# Patient Record
Sex: Male | Born: 1990 | Race: Black or African American | Hispanic: No | Marital: Single | Smoking: Never smoker
Health system: Southern US, Community
[De-identification: ages and names within clinical notes are randomized; demographics above are authoritative.]

## PROBLEM LIST (undated history)

## (undated) DIAGNOSIS — I10 Essential (primary) hypertension: Secondary | ICD-10-CM

---
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
## 2012-07-02 ENCOUNTER — Emergency Department (HOSPITAL_COMMUNITY)
Admission: EM | Admit: 2012-07-02 | Discharge: 2012-07-02 | Disposition: A | Discharge status: Home / Self Care | Payer: BC Managed Care – PPO | Attending: Emergency Medicine | Admitting: Emergency Medicine

## 2012-07-02 ENCOUNTER — Encounter (HOSPITAL_COMMUNITY): Payer: Self-pay | Admitting: *Deleted

## 2012-07-02 DIAGNOSIS — R509 Fever, unspecified: Secondary | ICD-10-CM | POA: Insufficient documentation

## 2012-07-02 DIAGNOSIS — I10 Essential (primary) hypertension: Secondary | ICD-10-CM | POA: Insufficient documentation

## 2012-07-02 DIAGNOSIS — Z79899 Other long term (current) drug therapy: Secondary | ICD-10-CM | POA: Insufficient documentation

## 2012-07-02 DIAGNOSIS — J029 Acute pharyngitis, unspecified: Secondary | ICD-10-CM

## 2012-07-02 HISTORY — DX: Essential (primary) hypertension: I10

## 2012-07-02 LAB — COMPREHENSIVE METABOLIC PANEL
Albumin: 4.4 g/dL (ref 3.5–5.2)
Alkaline Phosphatase: 64 U/L (ref 39–117)
BUN: 17 mg/dL (ref 6–23)
Chloride: 103 mEq/L (ref 96–112)
Creatinine, Ser: 1.3 mg/dL (ref 0.50–1.35)
Sodium: 140 mEq/L (ref 135–145)
Total Protein: 7.7 g/dL (ref 6.0–8.3)

## 2012-07-02 LAB — CBC WITH DIFFERENTIAL/PLATELET
Eosinophils Absolute: 0 10*3/uL (ref 0.0–0.7)
HCT: 44.4 % (ref 39.0–52.0)
Hemoglobin: 15.9 g/dL (ref 13.0–17.0)
Lymphocytes Relative: 18 % (ref 12–46)
MCH: 30.5 pg (ref 26.0–34.0)
Monocytes Absolute: 1.3 10*3/uL — ABNORMAL HIGH (ref 0.1–1.0)
Monocytes Relative: 15 % — ABNORMAL HIGH (ref 3–12)
Platelets: 191 10*3/uL (ref 150–400)
RBC: 5.22 MIL/uL (ref 4.22–5.81)
WBC: 8.4 10*3/uL (ref 4.0–10.5)

## 2012-07-02 LAB — LACTIC ACID, PLASMA: Lactic Acid, Venous: 1 mmol/L (ref 0.5–2.2)

## 2012-07-02 MED ORDER — ACETAMINOPHEN 325 MG PO TABS
650.0000 mg | ORAL_TABLET | Freq: Four times a day (QID) | ORAL | Status: DC | PRN
  Administered 2012-07-02: 650 mg via ORAL
  Filled 2012-07-02: qty 2

## 2012-07-02 MED ORDER — PENICILLIN G BENZATHINE 1200000 UNIT/2ML IM SUSP
1.2000 10*6.[IU] | Freq: Once | INTRAMUSCULAR | Status: AC
  Administered 2012-07-02: 1.2 10*6.[IU] via INTRAMUSCULAR
  Filled 2012-07-02: qty 2

## 2012-07-02 MED ORDER — DEXAMETHASONE 10 MG/ML FOR PEDIATRIC ORAL USE
10.0000 mg | Freq: Once | INTRAMUSCULAR | Status: AC
  Administered 2012-07-02: 10 mg via ORAL
  Filled 2012-07-02: qty 1

## 2012-07-02 NOTE — ED Notes (Signed)
Sore throat, fever, chills, fatigue since yesterday 0100.  Took 600 mg ibuprofen prior to coming into hospital tonite.

## 2012-07-02 NOTE — ED Notes (Signed)
Pt comfortable with d/c and f/u instructions. 

## 2012-07-02 NOTE — ED Provider Notes (Signed)
History     CSN: [id number]  Arrival date & time 07/02/12  0020   First MD Initiated Contact with Patient 07/02/12 0128      Chief Complaint  Patient presents with  . Fever  . Sore Throat     HPI Pt was seen at 0150.   Per pt, c/o gradual onset and persistence of constant sore throat since yesterday.  Has been associated with home fevers.  States he has been taking motrin without relief. Denies rash, no CP/SOB, no N/V/D, no abd pain.    Past Medical History  Diagnosis Date  . Hypertension     History reviewed. No pertinent past surgical history.   History  Substance Use Topics  . Smoking status: Never Smoker   . Smokeless tobacco: Not on file  . Alcohol Use: No      Review of Systems ROS: Statement: All systems negative except as marked or noted in the HPI; Constitutional: +fever and chills. ; ; Eyes: Negative for eye pain, redness and discharge. ; ; ENMT: Negative for ear pain, hoarseness, nasal congestion, sinus pressure and +sore throat. ; ; Cardiovascular: Negative for chest pain, palpitations, diaphoresis, dyspnea and peripheral edema. ; ; Respiratory: Negative for cough, wheezing and stridor. ; ; Gastrointestinal: Negative for nausea, vomiting, diarrhea, abdominal pain, blood in stool, hematemesis, jaundice and rectal bleeding. . ; ; Genitourinary: Negative for dysuria, flank pain and hematuria. ; ; Musculoskeletal: Negative for back pain and neck pain. Negative for swelling and trauma.; ; Skin: Negative for pruritus, rash, abrasions, blisters, bruising and skin lesion.; ; Neuro: Negative for headache, lightheadedness and neck stiffness. Negative for weakness, altered level of consciousness , altered mental status, extremity weakness, paresthesias, involuntary movement, seizure and syncope.       Allergies  Review of patient's allergies indicates no known allergies.  Home Medications   Current Outpatient Rx  Name  Route  Sig  Dispense  Refill  . amLODipine  (NORVASC) 10 MG tablet   Oral   Take 10 mg by mouth daily.         . Cyanocobalamin (VITAMIN B-12 PO)   Oral   Take 1 tablet by mouth daily.         Marland Kitchen ibuprofen (ADVIL,MOTRIN) 200 MG tablet   Oral   Take 600 mg by mouth every 6 (six) hours as needed for pain.         Marland Kitchen lisinopril (PRINIVIL,ZESTRIL) 10 MG tablet   Oral   Take 10 mg by mouth daily.         . Pseudoeph-Doxylamine-DM-APAP (NYQUIL PO)   Oral   Take 30 mLs by mouth every 12 (twelve) hours as needed (for cold symptoms).           BP 158/82  Pulse 114  Temp(Src) 99.9 F (37.7 C) (Oral)  SpO2 96%  Physical Exam 0155: Physical examination:  Nursing notes reviewed; Vital signs and O2 SAT reviewed;  Constitutional: Well developed, Well nourished, Well hydrated, In no acute distress; Head:  Normocephalic, atraumatic; Eyes: EOMI, PERRL, No scleral icterus; ENMT: TM's clear bilat. +edemetous nasal turbinates bilat with clear rhinorrhea.  +posterior pharyngeal erythema, +tonsillar erythema and scant exudate, Grade III tonsils. Mouth and pharynx without lesions. No intra-oral edema. No submandibular or sublingual edema. No hoarse voice, no drooling, no stridor. No pain with manipulation of larynx. Mouth normal, Mucous membranes moist; Neck: Supple, Full range of motion, +anterior cervical lymphadenopathy; Cardiovascular: Regular rate and rhythm, No murmur, rub, or gallop; Respiratory:  Breath sounds clear & equal bilaterally, No rales, rhonchi, wheezes.  Speaking full sentences with ease, Normal respiratory effort/excursion; Chest: Nontender, Movement normal; Abdomen: Soft, Nontender, Nondistended, Normal bowel sounds; Genitourinary: No CVA tenderness; Extremities: Pulses normal, No tenderness, No edema, No calf edema or asymmetry.; Neuro: AA&Ox3, Major CN grossly intact.  Speech clear. No gross focal motor or sensory deficits in extremities.; Skin: Color normal, Warm, Dry.   ED Course  Procedures     MDM   MDM Reviewed: previous chart, nursing note and vitals Interpretation: labs     Results for orders placed during the hospital encounter of 07/02/12  RAPID STREP SCREEN      Result Value Range   Streptococcus, Group A Screen (Direct) NEGATIVE  NEGATIVE  LACTIC ACID, PLASMA      Result Value Range   Lactic Acid, Venous 1.0  0.5 - 2.2 mmol/L  CBC WITH DIFFERENTIAL      Result Value Range   WBC 8.4  4.0 - 10.5 K/uL   RBC 5.22  4.22 - 5.81 MIL/uL   Hemoglobin 15.9  13.0 - 17.0 g/dL   HCT 16.1  09.6 - 04.5 %   MCV 85.1  78.0 - 100.0 fL   MCH 30.5  26.0 - 34.0 pg   MCHC 35.8  30.0 - 36.0 g/dL   RDW 40.9  81.1 - 91.4 %   Platelets 191  150 - 400 K/uL   Neutrophils Relative 66  43 - 77 %   Neutro Abs 5.6  1.7 - 7.7 K/uL   Lymphocytes Relative 18  12 - 46 %   Lymphs Abs 1.6  0.7 - 4.0 K/uL   Monocytes Relative 15 (*) 3 - 12 %   Monocytes Absolute 1.3 (*) 0.1 - 1.0 K/uL   Eosinophils Relative 0  0 - 5 %   Eosinophils Absolute 0.0  0.0 - 0.7 K/uL   Basophils Relative 0  0 - 1 %   Basophils Absolute 0.0  0.0 - 0.1 K/uL  COMPREHENSIVE METABOLIC PANEL      Result Value Range   Sodium 140  135 - 145 mEq/L   Potassium 4.3  3.5 - 5.1 mEq/L   Chloride 103  96 - 112 mEq/L   CO2 28  19 - 32 mEq/L   Glucose, Bld 98  70 - 99 mg/dL   BUN 17  6 - 23 mg/dL   Creatinine, Ser 7.82  0.50 - 1.35 mg/dL   Calcium 9.4  8.4 - 95.6 mg/dL   Total Protein 7.7  6.0 - 8.3 g/dL   Albumin 4.4  3.5 - 5.2 g/dL   AST 19  0 - 37 U/L   ALT 27  0 - 53 U/L   Alkaline Phosphatase 64  39 - 117 U/L   Total Bilirubin 0.2 (*) 0.3 - 1.2 mg/dL   GFR calc non Af Amer 79 (*) >90 mL/min   GFR calc Af Amer >90  >90 mL/min     0300:  4 Centor criteria, will tx with abx. Pt agreeable to IM PCN while in the ED.  PO decadron given for discomfort.  APAP given for fever.  States he "feels better" and wants to go home.  Dx and testing d/w pt.  Questions answered.  Verb understanding, agreeable to d/c home with outpt  f/u.          Laray Anger, DO 07/06/12 [phone]

## 2015-07-06 ENCOUNTER — Emergency Department (HOSPITAL_BASED_OUTPATIENT_CLINIC_OR_DEPARTMENT_OTHER): Payer: BLUE CROSS/BLUE SHIELD

## 2015-07-06 ENCOUNTER — Encounter (HOSPITAL_BASED_OUTPATIENT_CLINIC_OR_DEPARTMENT_OTHER): Payer: Self-pay | Admitting: Emergency Medicine

## 2015-07-06 ENCOUNTER — Emergency Department (HOSPITAL_BASED_OUTPATIENT_CLINIC_OR_DEPARTMENT_OTHER)
Admission: EM | Admit: 2015-07-06 | Discharge: 2015-07-06 | Disposition: A | Discharge status: Home / Self Care | Payer: BLUE CROSS/BLUE SHIELD | Attending: Emergency Medicine | Admitting: Emergency Medicine

## 2015-07-06 DIAGNOSIS — R05 Cough: Secondary | ICD-10-CM

## 2015-07-06 DIAGNOSIS — J029 Acute pharyngitis, unspecified: Secondary | ICD-10-CM | POA: Diagnosis present

## 2015-07-06 DIAGNOSIS — J069 Acute upper respiratory infection, unspecified: Secondary | ICD-10-CM | POA: Diagnosis not present

## 2015-07-06 DIAGNOSIS — R059 Cough, unspecified: Secondary | ICD-10-CM

## 2015-07-06 DIAGNOSIS — Z79899 Other long term (current) drug therapy: Secondary | ICD-10-CM | POA: Diagnosis not present

## 2015-07-06 DIAGNOSIS — I10 Essential (primary) hypertension: Secondary | ICD-10-CM | POA: Diagnosis not present

## 2015-07-06 LAB — RAPID STREP SCREEN (MED CTR MEBANE ONLY): STREPTOCOCCUS, GROUP A SCREEN (DIRECT): NEGATIVE

## 2015-07-06 MED ORDER — BENZONATATE 100 MG PO CAPS: 100.0000 mg | ORAL_CAPSULE | Freq: Three times a day (TID) | ORAL | Status: DC

## 2015-07-06 MED ORDER — IBUPROFEN 800 MG PO TABS: 800.0000 mg | ORAL_TABLET | Freq: Three times a day (TID) | ORAL | Status: AC

## 2015-07-06 NOTE — ED Provider Notes (Signed)
CSN: [id number]     Arrival date & time 07/06/15  2057 History   First MD Initiated Contact with Patient 07/06/15 2125     Chief Complaint  Patient presents with  . Sore Throat    (Consider location/radiation/quality/duration/timing/severity/associated sxs/prior Treatment) Patient is a 25 y.o. male presenting with pharyngitis. The history is provided by the patient and medical records. No language interpreter was used.  Sore Throat Associated symptoms include congestion, coughing, a fever and a sore throat. Pertinent negatives include no abdominal pain, nausea, neck pain, rash or vomiting.  Clinton Peterson is a 25 y.o. male  with a PMH of HTN who presents to the Emergency Department complaining of worsening productive cough, sore throat 2 days. Associated symptoms include subjective fever and nasal congestion. TheraFlu taken prior to arrival with little relief of symptoms. Denies chest pain, shortness of breath, abdominal pain, n/v.    Past Medical History  Diagnosis Date  . Hypertension    History reviewed. No pertinent past surgical history. History reviewed. No pertinent family history. Social History  Substance Use Topics  . Smoking status: Never Smoker   . Smokeless tobacco: None  . Alcohol Use: Yes     Comment: rarly    Review of Systems  Constitutional: Positive for fever.  HENT: Positive for congestion and sore throat.   Eyes: Negative for visual disturbance.  Respiratory: Positive for cough. Negative for shortness of breath and wheezing.   Cardiovascular: Negative.   Gastrointestinal: Negative for nausea, vomiting and abdominal pain.  Genitourinary: Negative for dysuria.  Musculoskeletal: Negative for back pain and neck pain.  Skin: Negative for rash.  Neurological: Negative for dizziness.      Allergies  Review of patient's allergies indicates no known allergies.  Home Medications   Prior to Admission medications   Medication Sig Start Date End Date Taking?  Authorizing Provider  amLODipine (NORVASC) 10 MG tablet Take 10 mg by mouth daily.    Historical Provider, MD  benzonatate (TESSALON) 100 MG capsule Take 1 capsule (100 mg total) by mouth every 8 (eight) hours. 07/06/15   Chase Picket Ward, PA-C  Cyanocobalamin (VITAMIN B-12 PO) Take 1 tablet by mouth daily.    Historical Provider, MD  ibuprofen (ADVIL,MOTRIN) 800 MG tablet Take 1 tablet (800 mg total) by mouth 3 (three) times daily. 07/06/15   Chase Picket Ward, PA-C  lisinopril (PRINIVIL,ZESTRIL) 10 MG tablet Take 10 mg by mouth daily.    Historical Provider, MD  Pseudoeph-Doxylamine-DM-APAP (NYQUIL PO) Take 30 mLs by mouth every 12 (twelve) hours as needed (for cold symptoms).    Historical Provider, MD   BP 151/84 mmHg  Pulse 110  Temp(Src) 99.4 F (37.4 C) (Oral)  Resp 18  Ht  (1.854 m)  Wt 108.863 kg  BMI 31.67 kg/m2  SpO2 98% Physical Exam  Constitutional: He is oriented to person, place, and time. He appears well-developed and well-nourished. No distress.  HENT:  Head: Normocephalic and atraumatic.  OP with erythema, no exudates or tonsillar hypertrophy. + nasal congestion with mucosal edema.   Neck: Normal range of motion. Neck supple.  No meningeal signs.   Cardiovascular: Normal rate, regular rhythm and normal heart sounds.   Pulmonary/Chest: Effort normal.  Lungs are clear to auscultation bilaterally - no w/r/r  Abdominal: Soft. He exhibits no distension. There is no tenderness.  Musculoskeletal: Normal range of motion.  Neurological: He is alert and oriented to person, place, and time.  Skin: Skin is warm and dry. He  is not diaphoretic.  Nursing note and vitals reviewed.   ED Course  Procedures (including critical care time) Labs Review Labs Reviewed  [facility]ID ST[facility] ([facility])  CULTURE, GROUP A STREP [facility]    Imaging Review Dg Chest 2 View  07/06/2015  CLINICAL DATA:  25 year old male with cough EXAM: CHEST  2 VIEW COMPARISON:  None. FINDINGS: The  heart size and mediastinal contours are within normal limits. Both lungs are clear. The visualized skeletal structures are unremarkable. IMPRESSION: No active cardiopulmonary disease. Electronically Signed   By: Elgie CollardArash  Radparvar M.D.   On: 07/06/2015 23:13   I have personally reviewed and evaluated these images and lab results as part of my medical decision-making.   EKG Interpretation None      MDM   Final diagnoses:  Cough  URI (upper respiratory infection)   Clinton Peterson is non-toxic appearing with a clear lung exam.  Temp 99.4. Mild rhinorrhea and OP with erythema, no exudates. Rapid strep negative, chest x-ray unremarkable. Likely viral URI. Patient is agreeable to symptomatic treatment with close follow up with PCP as needed but spoke at length about emergent, changing, or worsening of symptoms that should prompt return to ER. Patient voices understanding and is agreeable to plan. rx for ibuprofen and tessalon given.   Blood pressure 151/84, pulse 110, temperature 99.4 F (37.4 C), temperature source Oral, resp. rate 18, height 6\' 1"  (1.854 m), weight 108.863 kg, SpO2 98 %.  [facility] Va [facility] CenterJaime Pilcher Ward, PA-C 07/06/15 2334  Melene Planan Floyd, DO 07/07/15 Ernestina [id number]

## 2015-07-06 NOTE — Discharge Instructions (Signed)
1. Medications: mucinex as needed for congestion, tessalon as needed for cough, usual home medications 2. Treatment: rest, increase hydration, drink plenty of fluids, take ibuprofen for body aches/fever control 3. Follow Up: Please follow up with your primary doctor in 3 days if no improvement in symptoms for discussion of your diagnoses and further evaluation after today's visit; Return to the ER for high fevers, difficulty breathing or other concerning symptoms

## 2015-07-06 NOTE — ED Notes (Signed)
Patient states that he has a fever and cough with sore throat x 2 days. Took theraflu night time prior to coming to hospital

## 2015-07-09 LAB — CULTURE, GROUP A STREP (THRC)

## 2017-07-10 IMAGING — CR DG CHEST 2V
2 series · 2 of 2 positions shown · non-contrast
Comparison: None.

CLINICAL DATA: 24-year-old male with cough

EXAM:
CHEST  2 VIEW

[w chest pa]
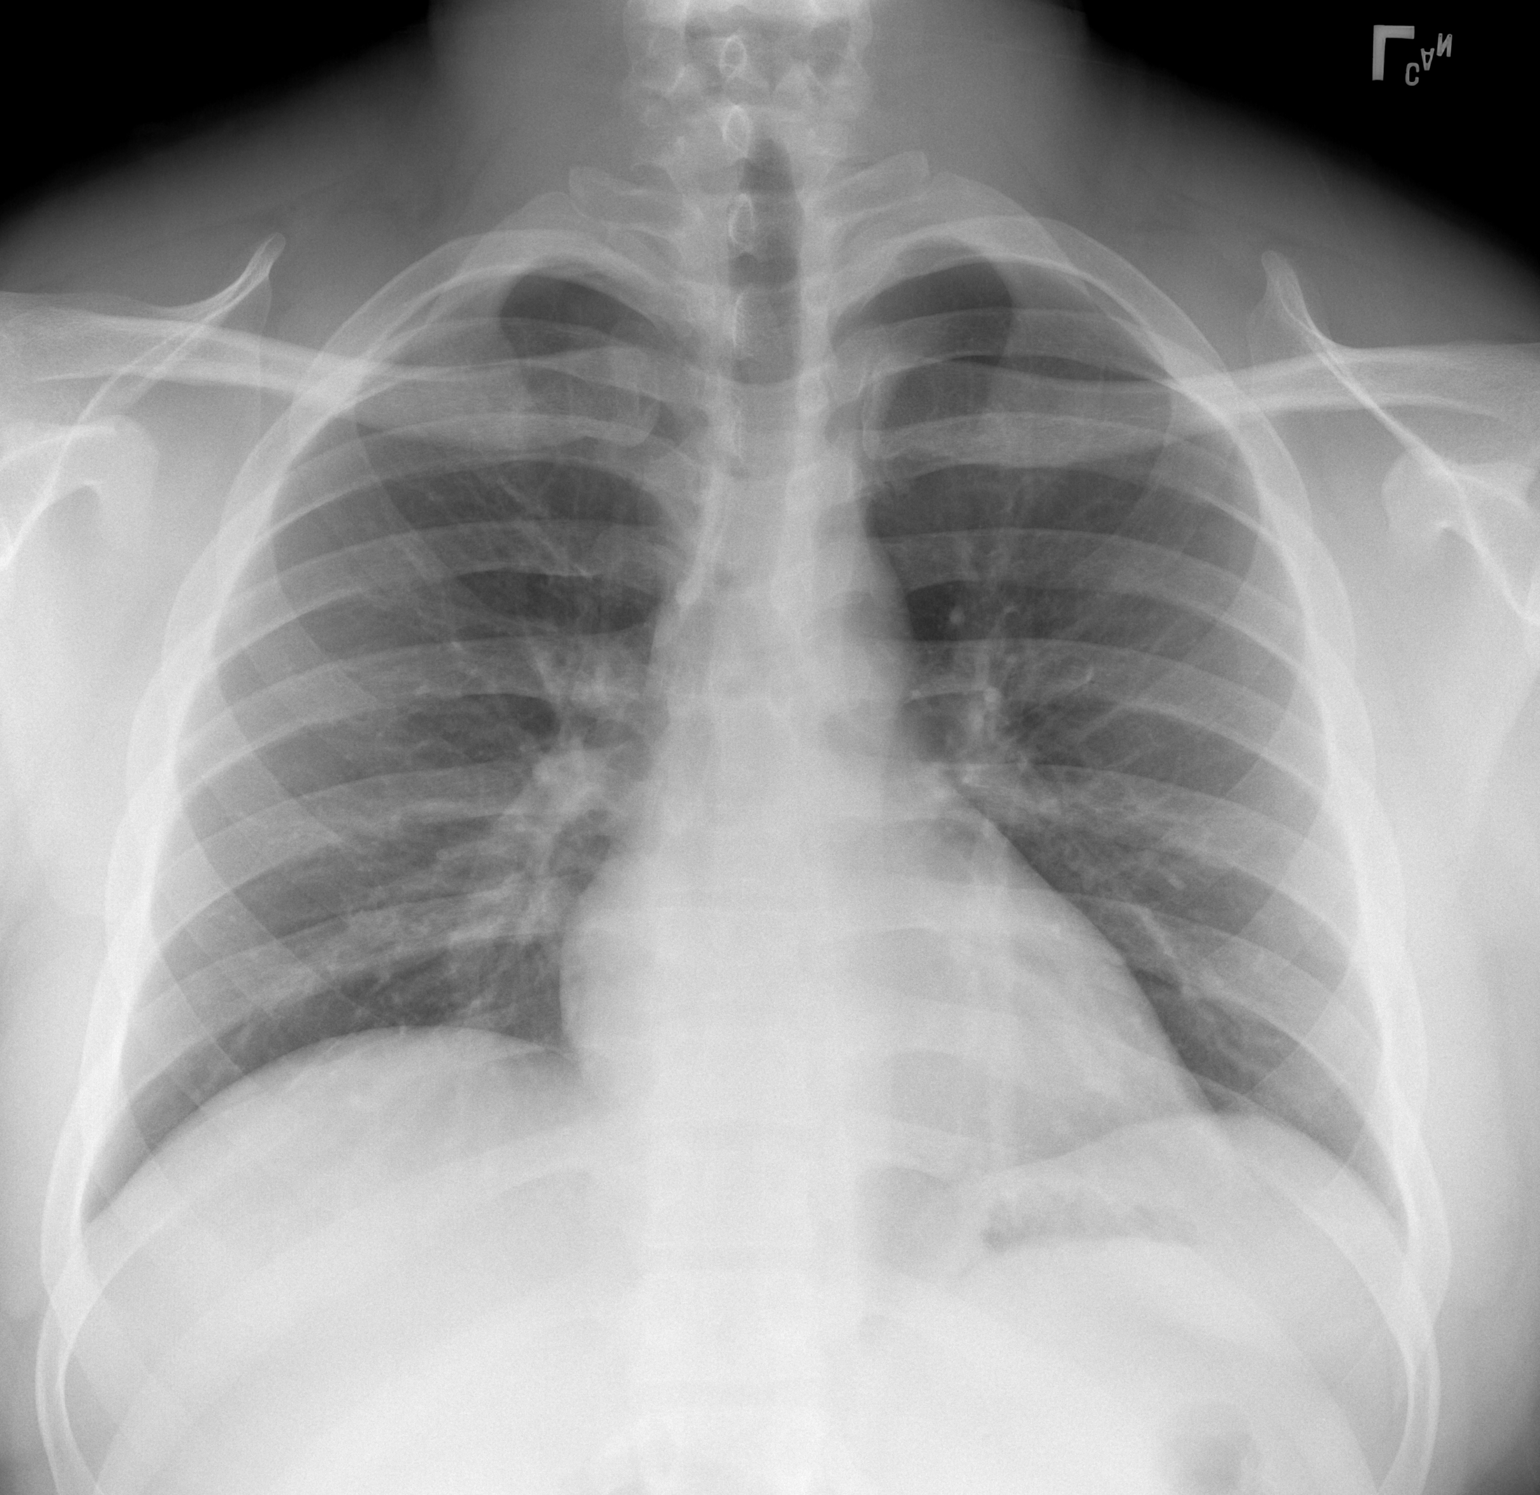

[w chest lat]
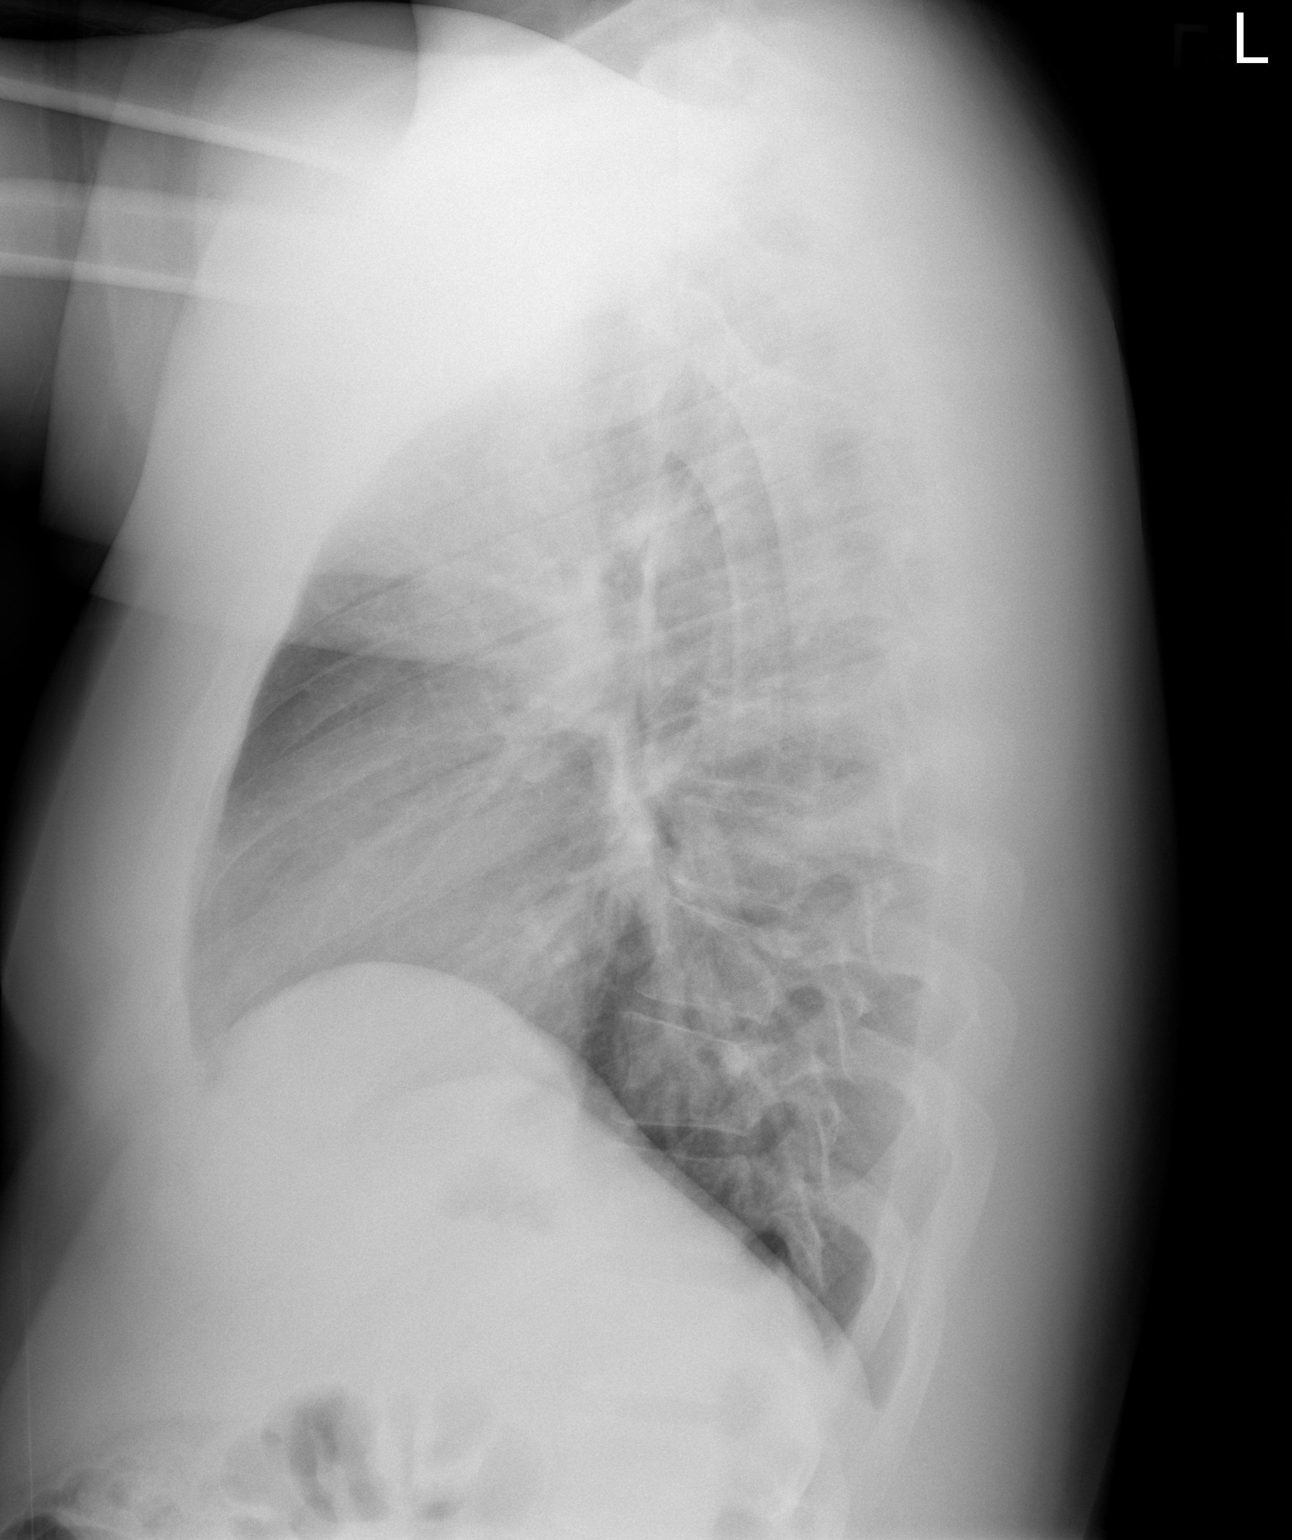

[2 of 2 positions shown; findings below may reference images not displayed]

FINDINGS: The heart size and mediastinal contours are within normal limits.
Both lungs are clear. The visualized skeletal structures are
unremarkable.
IMPRESSION: No active cardiopulmonary disease.

## 2019-05-07 ENCOUNTER — Ambulatory Visit: Payer: Self-pay | Attending: Internal Medicine

## 2019-05-07 DIAGNOSIS — Z23 Encounter for immunization: Secondary | ICD-10-CM

## 2019-05-07 NOTE — Progress Notes (Signed)
   Covid-19 Vaccination Clinic  Name:  Clinton Peterson    MRN: [id number] DOB: 07/15/1990  05/07/2019  [name] was observed post Covid-19 immunization for 15 minutes without incident. He was provided with Vaccine Information Sheet and instruction to access the V-Safe system.   [name] was instructed to call 911 with any severe reactions post vaccine: Marland Kitchen Difficulty breathing  . Swelling of face and throat  . A fast heartbeat  . A bad rash all over body  . Dizziness and weakness   Immunizations Administered    Name Date Dose VIS Date Route   Pfizer COVID-19 Vaccine 05/07/2019  1:38 PM 0.3 mL 02/02/2019 Intramuscular   Manufacturer: [employer], [employer]   Lot: [id number]   NDC: [address]-8

## 2019-05-29 ENCOUNTER — Ambulatory Visit: Payer: Self-pay | Attending: Internal Medicine

## 2019-05-29 DIAGNOSIS — Z23 Encounter for immunization: Secondary | ICD-10-CM

## 2019-05-29 NOTE — Progress Notes (Signed)
   Covid-19 Vaccination Clinic  Name:  WYLAN GENTZLER    MRN: [id number] DOB: [birth year]  05/29/2019  Mr. Gorter was observed post Covid-19 immunization for 15 minutes without incident. He was provided with Vaccine Information Sheet and instruction to access the V-Safe system.   Mr. Asmar was instructed to call 911 with any severe reactions post vaccine: Marland Kitchen Difficulty breathing  . Swelling of face and throat  . A fast heartbeat  . A bad rash all over body  . Dizziness and weakness   Immunizations Administered    Name Date Dose VIS Date Route   Pfizer COVID-19 Vaccine 05/29/2019  3:55 PM 0.3 mL 02/02/2019 Intramuscular   Manufacturer: [employer], [employer]   Lot: [id number]   NDC: [address]-9

## 2023-02-22 ENCOUNTER — Ambulatory Visit (INDEPENDENT_AMBULATORY_CARE_PROVIDER_SITE_OTHER): Payer: Managed Care, Other (non HMO)

## 2023-02-22 ENCOUNTER — Encounter (HOSPITAL_COMMUNITY): Payer: Self-pay | Admitting: Emergency Medicine

## 2023-02-22 ENCOUNTER — Ambulatory Visit (HOSPITAL_COMMUNITY)
Admission: EM | Admit: 2023-02-22 | Discharge: 2023-02-22 | Disposition: A | Discharge status: Home / Self Care | Payer: Managed Care, Other (non HMO) | Attending: Internal Medicine | Admitting: Internal Medicine

## 2023-02-22 DIAGNOSIS — S6991XA Unspecified injury of right wrist, hand and finger(s), initial encounter: Secondary | ICD-10-CM | POA: Diagnosis not present

## 2023-02-22 DIAGNOSIS — S63104A Unspecified dislocation of right thumb, initial encounter: Secondary | ICD-10-CM

## 2023-02-22 NOTE — ED Provider Notes (Addendum)
 [facility]    CSN: [id number] Arrival date & time: 02/22/23  9050      History   Chief Complaint Chief Complaint  Patient presents with   Finger Injury    HPI Clinton Peterson is a 32 y.o. male who presents with R thumb injjury when his dog pulled on the leash jerking it and caused it to hit  the tip of his thumb on the wall.     Past Medical History:  Diagnosis Date   Hypertension     There are no active problems to display for this patient.   History reviewed. No pertinent surgical history.     Home Medications    Prior to Admission medications   Medication Sig Start Date End Date Taking? Authorizing Provider  amLODipine (NORVASC) 10 MG tablet Take 10 mg by mouth daily.    [provider]  ibuprofen  (ADVIL ,MOTRIN ) 800 MG tablet Take 1 tablet (800 mg total) by mouth 3 (three) times daily. 07/06/15   Ward, Jaime Pilcher, PA-C  lisinopril (PRINIVIL,ZESTRIL) 10 MG tablet Take 10 mg by mouth daily.    [provider]    Family History No family history on file.  Social History Social History   Tobacco Use   Smoking status: Never  Substance Use Topics   Alcohol use: Yes    Comment: rarly   Drug use: No     Allergies   Patient has no known allergies.   Review of Systems Review of Systems  Musculoskeletal:  Positive for arthralgias and joint swelling.  Skin:  Positive for color change. Negative for pallor, rash and wound.  Neurological:  Negative for numbness.     Physical Exam Triage Vital Signs ED Triage Vitals [02/22/23 1011]  Encounter Vitals Group     BP (!) 159/97     Systolic BP Percentile      Diastolic BP Percentile      Pulse Rate 81     Resp 15     Temp 97.7 F (36.5 C)     Temp Source Oral     SpO2 97 %     Weight      Height      Head Circumference      Peak Flow      Pain Score 6     Pain Loc      Pain Education      Exclude from Growth Chart    No data found.  Updated Vital Signs BP (!)  159/97 (BP Location: Left Arm)   Pulse 81   Temp 97.7 F (36.5 C) (Oral)   Resp 15   SpO2 97%   Visual Acuity Right Eye Distance:   Left Eye Distance:   Bilateral Distance:    Right Eye Near:   Left Eye Near:    Bilateral Near:     Physical Exam Vitals and nursing note reviewed.  Constitutional:      Appearance: He is obese.  Eyes:     General: No scleral icterus.    Conjunctiva/sclera: Conjunctivae normal.  Pulmonary:     Effort: Pulmonary effort is normal.  Musculoskeletal:     Cervical back: Neck supple.     Comments: R mid and distal area is swollen, he is unable to flex it at all distally.   Skin:    General: Skin is warm and dry.     Capillary Refill: Capillary refill takes less than 2 seconds.     Findings: Bruising  present.  Neurological:     Mental Status: He is alert and oriented to person, place, and time.     Gait: Gait normal.  Psychiatric:        Mood and Affect: Mood normal.        Behavior: Behavior normal.        Thought Content: Thought content normal.        Judgment: Judgment normal.      UC Treatments / Results  Labs (all labs ordered are listed, but only abnormal results are displayed) Labs Reviewed - No data to display  EKG   Radiology DG Finger Thumb Right Result Date: 02/22/2023 CLINICAL DATA:  Traction injury to the right thumb after dog pulled on leash EXAM: RIGHT THUMB 3V COMPARISON:  None Available. FINDINGS: Dorsal dislocation of the thumb distal phalanx relative to the proximal phalanx. Curvilinear radiodensity projecting over the dorsal aspect of the proximal phalangeal head, likely an avulsed fracture fragment. An ill-defined fracture fragment also projects over the palmar aspect of the interphalangeal joint. Fracture lucency is seen along the radial aspect of the thumb proximal phalangeal base. IMPRESSION: 1. Dorsal dislocation of the thumb distal phalanx relative to the proximal phalanx. 2. Avulsed fracture fragments projecting  over the dorsal aspect of the proximal phalangeal head and palmar aspect of the interphalangeal joint. 3. Nondisplaced intra-articular fracture along the radial aspect of the thumb proximal phalangeal base. Electronically Signed   By: Limin  Xu M.D.   On: 02/22/2023 12:48    Procedures Procedures (including critical care time)  Medications Ordered in UC Medications - No data to display  Initial Impression / Assessment and Plan / UC Course  I have reviewed the triage vital signs and the nursing notes.  Pertinent  imaging results that were available during my care of the patient were reviewed by me and considered in my medical decision making (see chart for details).  R thumb dislocations with avulsion fracture  He was placed on a splint and sent to [facility] for care today.      Final Clinical Impressions(s) / UC Diagnoses   Final diagnoses:  Thumb injury, right, initial encounter  Thumb dislocation, right, initial encounter   Discharge Instructions   None    ED Prescriptions   None    PDMP not reviewed this encounter.   Lindi Carter, PA-C 02/22/23 1329    Rodriguez[place], [place], PA-C 02/22/23 1515

## 2023-02-22 NOTE — ED Triage Notes (Signed)
Pt reports Sunday morning was putting dogs on leash to take out when dog pulled and pt's right thumb got jerked and hit wall. Denies any open skin. Reports pain and swelling. Pt was wearing brace upon triage. Took Tylenol for pain
